# Patient Record
Sex: Female | Born: 2000
Health system: Southern US, Community
[De-identification: ages and names within clinical notes are randomized; demographics above are authoritative.]

---

## 2019-04-06 ENCOUNTER — Other Ambulatory Visit: Payer: Self-pay

## 2019-04-06 ENCOUNTER — Ambulatory Visit
Admission: RE | Admit: 2019-04-06 | Discharge: 2019-04-06 | Disposition: A | Discharge status: Home / Self Care | Payer: BLUE CROSS/BLUE SHIELD | Source: Ambulatory Visit | Attending: Family Medicine | Admitting: Family Medicine

## 2019-04-06 ENCOUNTER — Ambulatory Visit
Admission: RE | Admit: 2019-04-06 | Discharge: 2019-04-06 | Disposition: A | Discharge status: Home / Self Care | Payer: BLUE CROSS/BLUE SHIELD | Attending: Family Medicine | Admitting: Family Medicine

## 2019-04-06 ENCOUNTER — Ambulatory Visit (INDEPENDENT_AMBULATORY_CARE_PROVIDER_SITE_OTHER): Payer: BLUE CROSS/BLUE SHIELD | Admitting: Family Medicine

## 2019-04-06 ENCOUNTER — Other Ambulatory Visit: Payer: Self-pay | Admitting: Family Medicine

## 2019-04-06 ENCOUNTER — Encounter: Payer: Self-pay | Admitting: Family Medicine

## 2019-04-06 VITALS — Temp 98.0°F

## 2019-04-06 DIAGNOSIS — S43432A Superior glenoid labrum lesion of left shoulder, initial encounter: Secondary | ICD-10-CM | POA: Insufficient documentation

## 2019-04-06 DIAGNOSIS — M25512 Pain in left shoulder: Secondary | ICD-10-CM

## 2019-04-06 DIAGNOSIS — S4992XA Unspecified injury of left shoulder and upper arm, initial encounter: Secondary | ICD-10-CM | POA: Diagnosis not present

## 2019-04-06 MED ORDER — DICLOFENAC SODIUM 75 MG PO TBEC: 75.0000 mg | DELAYED_RELEASE_TABLET | Freq: Two times a day (BID) | ORAL | 0 refills | Status: AC

## 2019-04-11 ENCOUNTER — Other Ambulatory Visit: Payer: Self-pay

## 2019-04-11 ENCOUNTER — Ambulatory Visit
Admission: RE | Admit: 2019-04-11 | Discharge: 2019-04-11 | Disposition: A | Discharge status: Home / Self Care | Payer: BLUE CROSS/BLUE SHIELD | Source: Ambulatory Visit | Attending: Family Medicine | Admitting: Family Medicine

## 2019-04-11 DIAGNOSIS — M25512 Pain in left shoulder: Secondary | ICD-10-CM

## 2019-04-16 NOTE — Progress Notes (Signed)
Patient presents with symptoms of left anterior shoulder pain. She states that while she was batting a few days ago her shoulder felt like it popped out of place. She had a similar episode happen over the summer. She has been unable to raise her arm without pain.   ROS: Negative except mentioned above. Vitals as per Epic.  GENERAL: NAD MSK: Left Shoulder - no obvious deformity, mild anterior tenderness, forward flexion to 110 degrees, +apprehension, nv intact  NEURO: CN II-XII grossly intact   A/P: Left Shoulder Injury - suggestive of subluxation, will get imaging to further evaluate, NSAIDs prn, [employer] 1-2x/day, f/u with Dr. Gerald Dexter the next time he is on campus, seek medical attention if any acute problems.

## 2019-10-23 DIAGNOSIS — S43432A Superior glenoid labrum lesion of left shoulder, initial encounter: Secondary | ICD-10-CM | POA: Diagnosis not present

## 2019-12-18 ENCOUNTER — Telehealth: Payer: Self-pay | Admitting: Family

## 2019-12-18 ENCOUNTER — Ambulatory Visit: Payer: BLUE CROSS/BLUE SHIELD | Admitting: Family

## 2019-12-18 DIAGNOSIS — M545 Low back pain, unspecified: Secondary | ICD-10-CM

## 2019-12-18 MED ORDER — CYCLOBENZAPRINE HCL 10 MG PO TABS: 10.0000 mg | ORAL_TABLET | Freq: Every day | ORAL | 0 refills | Status: AC

## 2019-12-18 NOTE — Telephone Encounter (Signed)
Pt c/o low back pain that started 1 week ago after a game.  No known injury.  She has been getting "treatment" (heat/ice, cupping, etc) from athletic trainer without help.  Taking otc Advil 800mg  po q 6.  Pt has a hx of muscle relaxer use and has taken Flexaril before for shoulder/back issues.  Pt is still able to play softball despite new onset of persistent tightness.  Reviewed low back pain prognosis with pt discussing that it can often take 4-6 weeks for pain to subside.  Pt will cont otc Advil decreasing frequency to q 8 hours. Flexaril sent to [facility] by HT.  Cont with treatments with athletic trainer.  Rtc with any new or worsening symptoms.

## 2021-01-01 IMAGING — MR MR SHOULDER*L* W/O CM
5 series · 33 of 40 positions shown · non-contrast
Comparison: Radiographs dated 04/06/2019

CLINICAL DATA: Left shoulder pain with painful range of motion
after dislocation last week.

EXAM:
MRI OF THE LEFT SHOULDER WITHOUT CONTRAST
TECHNIQUE: Multiplanar, multisequence MR imaging of the shoulder was performed.
No intravenous contrast was administered.

[Series 5: PD fat-sat · axial · left · 4.0mm · 0.55mm/px · z∈[-44,+86]mm · 8 of 28 slices shown (1 of 2)]
[im 1/28]
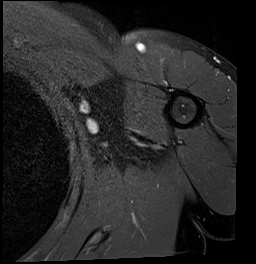
[im 4/28]
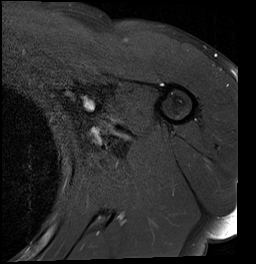
[im 10/28]
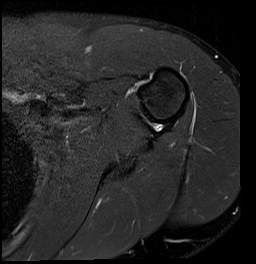
[im 13/28]
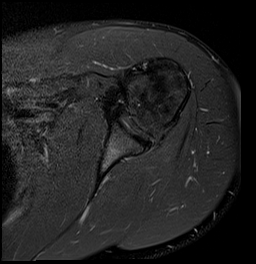
[im 16/28]
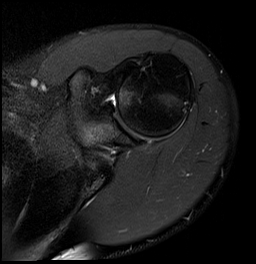
[im 19/28]
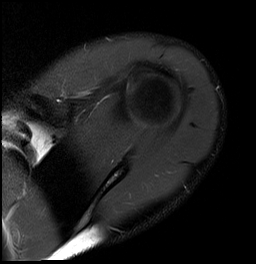
[im 25/28]
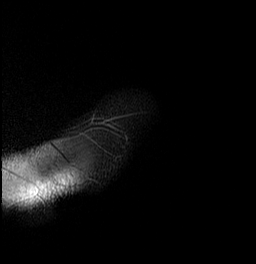
[im 28/28]
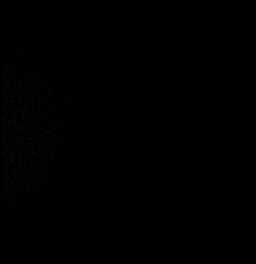

[Series 6: PD fat-sat · oblique · left · 4.0mm · 0.44mm/px · 8 of 26 slices shown (2 of 2)]
[im 1/26]
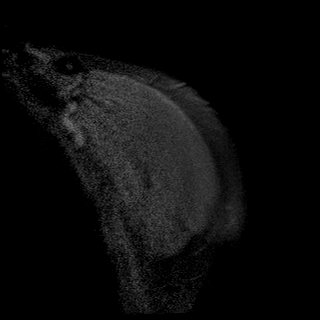
[im 4/26]
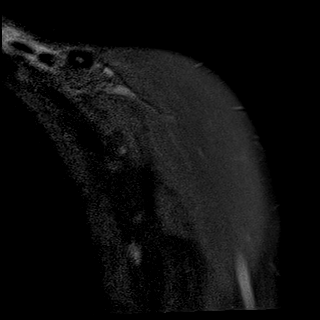
[im 8/26]
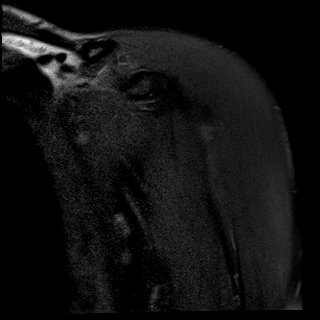
[im 11/26]
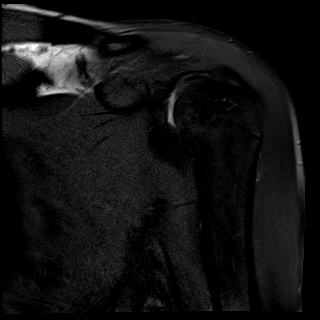
[im 15/26]
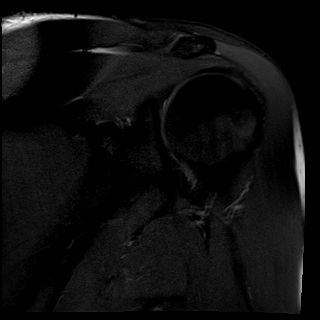
[im 18/26]
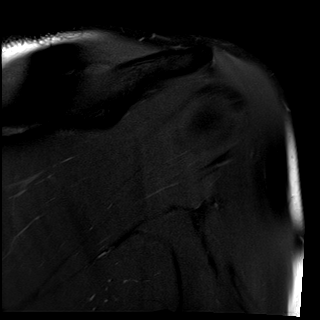
[im 22/26]
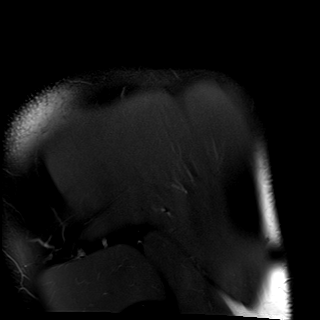
[im 26/26]
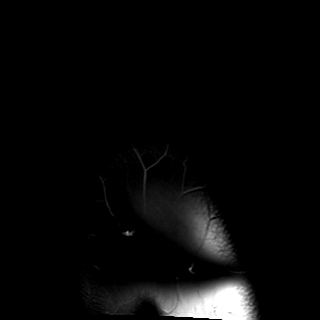

[Series 7: T2 fat-sat · oblique · left · 4.0mm · 0.44mm/px · 8 of 26 slices shown (1 of 2)]
[im 1/26]
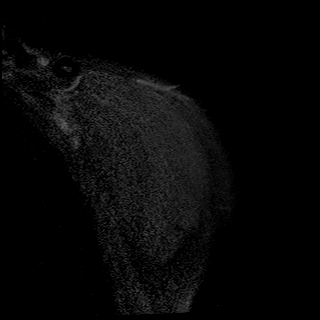
[im 4/26]
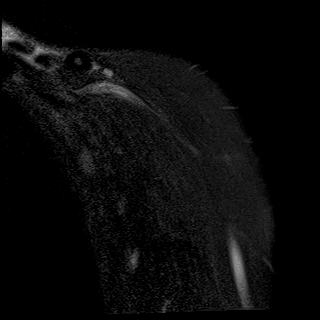
[im 8/26]
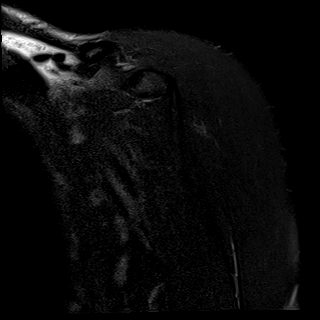
[im 11/26]
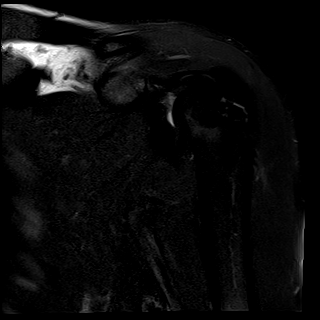
[im 15/26]
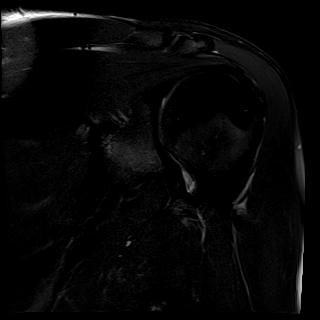
[im 18/26]
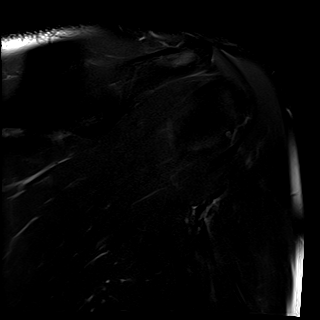
[im 22/26]
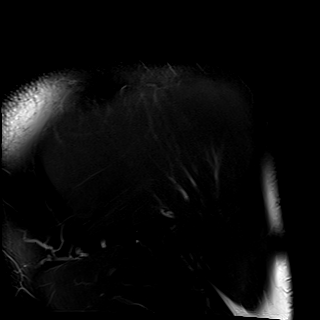
[im 26/26]
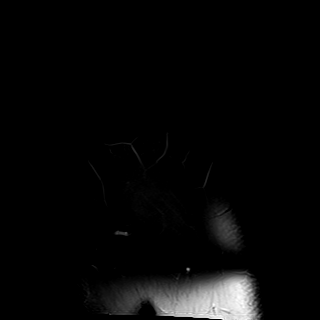

[Series 8: T2 fat-sat · oblique · left · 4.0mm · 0.23mm/px · 7 of 22 slices shown (2 of 2)]
[im 1/22]
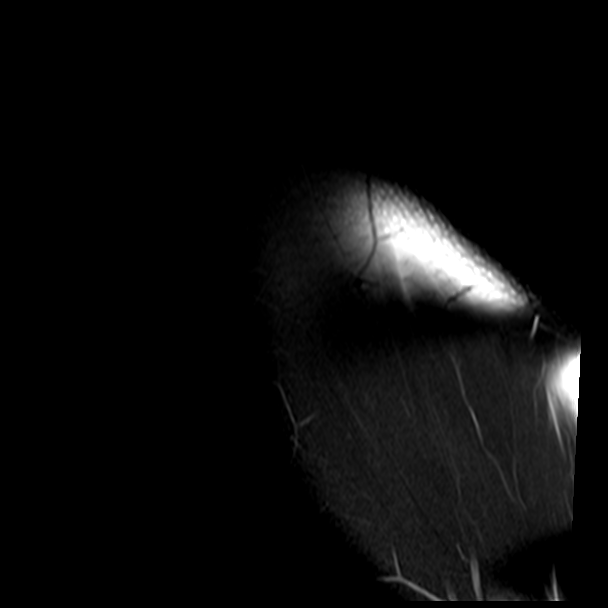
[im 4/22]
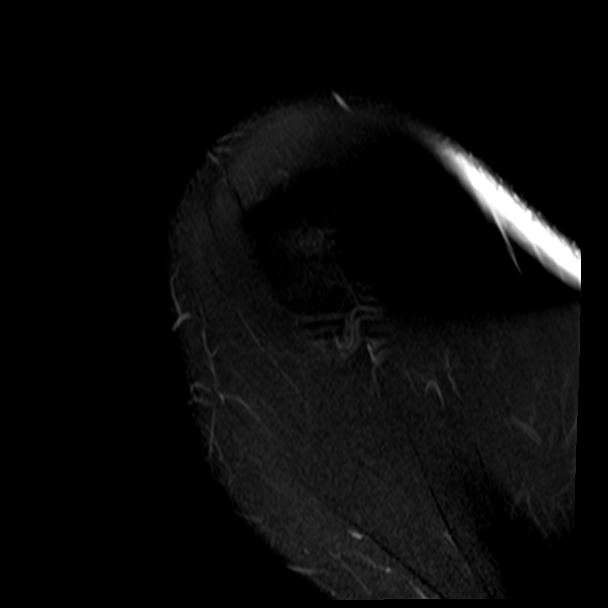
[im 8/22]
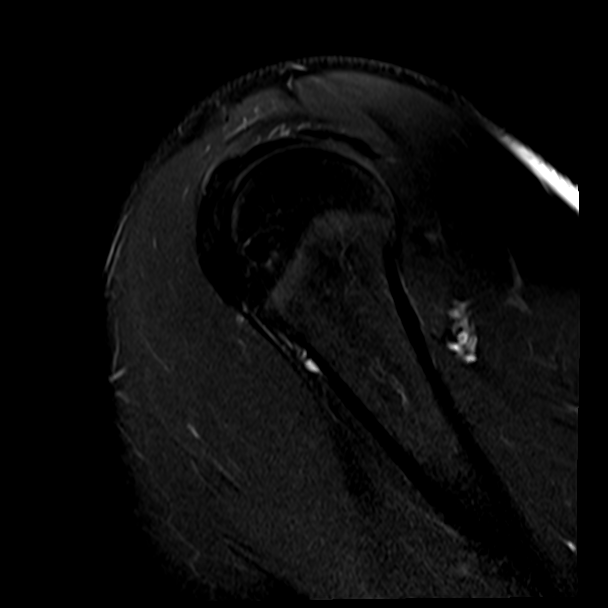
[im 11/22]
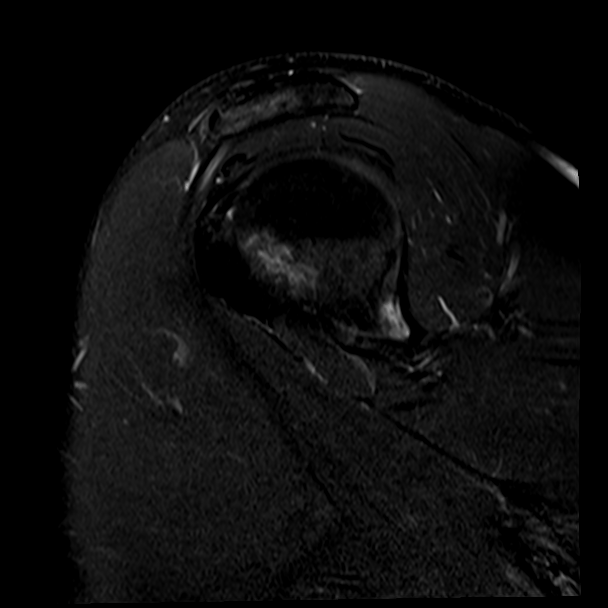
[im 15/22]
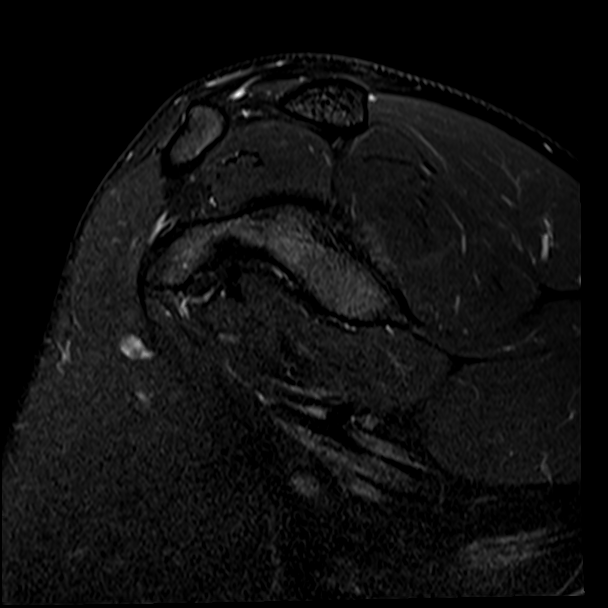
[im 18/22]
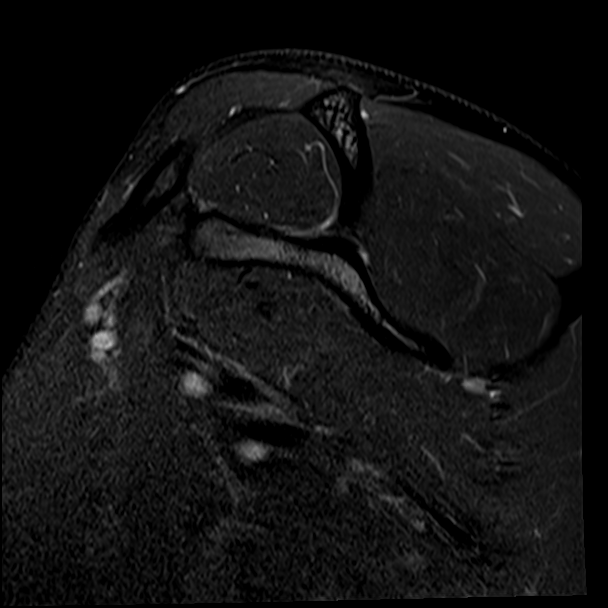
[im 22/22]
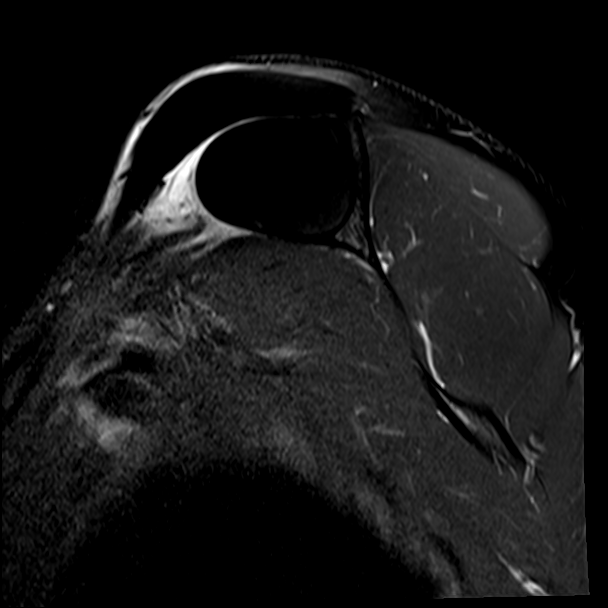

[Series 9: T1 · oblique · left · 4.0mm · 0.36mm/px · 2 of 22 slices shown]
[im 1/22]
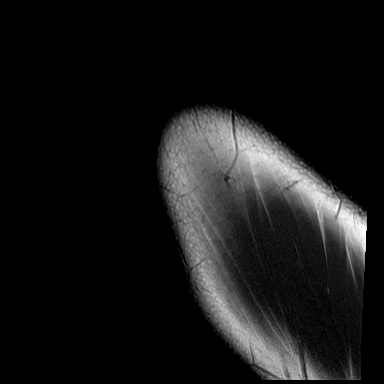
[im 4/22]
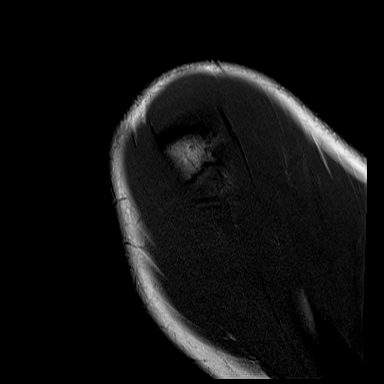

[33 of 40 positions shown; findings below may reference images not displayed]

FINDINGS: Rotator cuff:  Normal.

Muscles: No atrophy or abnormal signal of the muscles of the rotator
cuff. The other muscles adjacent to the left shoulder also appear
normal.

Biceps long head:  Properly located and intact.

Acromioclavicular Joint:  Normal.  Type 2 acromion. No bursitis

Glenohumeral Joint: No joint effusion. No chondral defect.

Labrum:  Normal.

Bones: There is a subtle area of increased T2 weighted signal in the
anteromedial aspect of the humeral head which I suspect is a normal
variation due to vascularity in that area. Alternatively, this could
represent a subtle bone contusion but that would be an unusual
location for a contusion.

Other: None
IMPRESSION: Essentially normal MRI of left shoulder.
